# Patient Record
Sex: Male | Born: 1955 | Race: Black or African American | Hispanic: No | Smoking: Current every day smoker
Health system: Southern US, Community
[De-identification: ages and names within clinical notes are randomized; demographics above are authoritative.]

## PROBLEM LIST (undated history)

## (undated) DIAGNOSIS — E785 Hyperlipidemia, unspecified: Secondary | ICD-10-CM

## (undated) DIAGNOSIS — K219 Gastro-esophageal reflux disease without esophagitis: Secondary | ICD-10-CM

## (undated) DIAGNOSIS — E119 Type 2 diabetes mellitus without complications: Secondary | ICD-10-CM

## (undated) DIAGNOSIS — I10 Essential (primary) hypertension: Secondary | ICD-10-CM

## (undated) HISTORY — DX: Hyperlipidemia, unspecified: E78.5

## (undated) HISTORY — DX: Type 2 diabetes mellitus without complications: E11.9

## (undated) HISTORY — DX: Essential (primary) hypertension: I10

## (undated) HISTORY — PX: HERNIA REPAIR: SHX51

## (undated) HISTORY — DX: Gastro-esophageal reflux disease without esophagitis: K21.9

---

## 2018-03-04 DIAGNOSIS — R569 Unspecified convulsions: Secondary | ICD-10-CM | POA: Insufficient documentation

## 2018-03-04 DIAGNOSIS — E1100 Type 2 diabetes mellitus with hyperosmolarity without nonketotic hyperglycemic-hyperosmolar coma (NKHHC): Secondary | ICD-10-CM | POA: Insufficient documentation

## 2018-03-04 DIAGNOSIS — J9601 Acute respiratory failure with hypoxia: Secondary | ICD-10-CM | POA: Insufficient documentation

## 2018-03-04 DIAGNOSIS — I5020 Unspecified systolic (congestive) heart failure: Secondary | ICD-10-CM | POA: Insufficient documentation

## 2018-03-31 ENCOUNTER — Encounter: Payer: Self-pay | Admitting: *Deleted

## 2018-03-31 ENCOUNTER — Encounter: Payer: BLUE CROSS/BLUE SHIELD | Attending: Family Medicine | Admitting: *Deleted

## 2018-03-31 VITALS — BP 110/60 | Ht 66.0 in | Wt 169.3 lb

## 2018-03-31 DIAGNOSIS — E119 Type 2 diabetes mellitus without complications: Secondary | ICD-10-CM | POA: Insufficient documentation

## 2018-03-31 NOTE — Patient Instructions (Signed)
Check blood sugars 2-3 x day before each meal  every day Bring blood sugar records to the next class  Exercise: Begin walking for  10 minutes  3 days a week and gradually to increase 30 minutes 5 x week  Eat 3 meals day,  2  snacks a day Space meals 4-6 hours apart Avoid sugar sweetened drinks (tea, sports drinks, juices)  Quit smoking  Make an eye doctor appointment  Carry fast acting glucose and a snack at all times Rotate injection sites  Return for classes on:

## 2018-03-31 NOTE — Progress Notes (Signed)
Diabetes Self-Management Education  Visit Type: First/Initial  Appt. Start Time: 1015 Appt. End Time: 1135  03/31/2018  Tommy Moore, identified by name and date of birth, is a 63 y.o. male with a diagnosis of Diabetes: Type 2.   ASSESSMENT  Blood pressure 110/60, height 5\' 6"  (1.676 m), weight 169 lb 4.8 oz (76.8 kg). Body mass index is 27.33 kg/m.  Diabetes Self-Management Education - 03/31/18 1336      Visit Information   Visit Type  First/Initial      Initial Visit   Diabetes Type  Type 2    Are you currently following a meal plan?  Yes    What type of meal plan do you follow?  "no beef, pork; lean meats, decreased fried foods, less sweets, 3 meals/day    Are you taking your medications as prescribed?  Yes    Date Diagnosed  Feb 28, 2018      Health Coping   How would you rate your overall health?  Fair      Psychosocial Assessment   Patient Belief/Attitude about Diabetes  Motivated to manage diabetes   "disbelief"   Self-care barriers  None    Self-management support  Doctor's office;Family    Other persons present  Spouse/SO    Patient Concerns  Nutrition/Meal planning;Medication;Healthy Lifestyle;Glycemic Control    Special Needs  None    Preferred Learning Style  Visual;Hands on;Auditory    Learning Readiness  Change in progress    How often do you need to have someone help you when you read instructions, pamphlets, or other written materials from your doctor or pharmacy?  1 - Never    What is the last grade level you completed in school?  some college      Pre-Education Assessment   Patient understands the diabetes disease and treatment process.  Needs Instruction    Patient understands incorporating nutritional management into lifestyle.  Needs Instruction    Patient undertands incorporating physical activity into lifestyle.  Needs Instruction    Patient understands using medications safely.  Needs Instruction    Patient understands monitoring blood  glucose, interpreting and using results  Needs Review    Patient understands prevention, detection, and treatment of acute complications.  Needs Instruction    Patient understands prevention, detection, and treatment of chronic complications.  Needs Instruction    Patient understands how to develop strategies to address psychosocial issues.  Needs Instruction    Patient understands how to develop strategies to promote health/change behavior.  Needs Instruction      Complications   Last HgB A1C per patient/outside source  16.1 %   03/02/18   How often do you check your blood sugar?  1-2 times/day    Fasting Blood glucose range (mg/dL)  64-332   Pt reports fasting blood sugars 109 mg/dL.   Postprandial Blood glucose range (mg/dL)  --   Pt reports readings before supper 129 mg/dL.    Have you had a dilated eye exam in the past 12 months?  Yes    Have you had a dental exam in the past 12 months?  Yes    Are you checking your feet?  No      Dietary Intake   Breakfast  cereal and milk and sometimes fruit; cheese adn eggs, with tomatoes and toast; salmon and eggs with fries    Snack (morning)  peanut butter crackers, jello, chips    Lunch  meat sandwich with fruit or salad; soup  Snack (afternoon)  same as morning    Dinner  chicken fish, shrimp with bread, potatoes, peas, beans, rice pasta, lettuce, tomatoes, broccoli, spinach, greens, cabbage, squash, okra    Beverage(s)  water, Gatorade, sugar sweetened tea, fruit juice weekly      Exercise   Exercise Type  ADL's      Patient Education   Previous Diabetes Education  No    Disease state   Definition of diabetes, type 1 and 2, and the diagnosis of diabetes;Factors that contribute to the development of diabetes    Nutrition management   Role of diet in the treatment of diabetes and the relationship between the three main macronutrients and blood glucose level;Food label reading, portion sizes and measuring food.;Reviewed blood glucose goals  for pre and post meals and how to evaluate the patients' food intake on their blood glucose level.;Meal timing in regards to the patients' current diabetes medication.    Physical activity and exercise   Role of exercise on diabetes management, blood pressure control and cardiac health.    Medications  Taught/reviewed insulin injection, site rotation, insulin storage and needle disposal.;Reviewed patients medication for diabetes, action, purpose, timing of dose and side effects.    Monitoring  Purpose and frequency of SMBG.;Taught/discussed recording of test results and interpretation of SMBG.;Identified appropriate SMBG and/or A1C goals.    Acute complications  Taught treatment of hypoglycemia - the 15 rule.    Chronic complications  Relationship between chronic complications and blood glucose control;Retinopathy and reason for yearly dilated eye exams    Psychosocial adjustment  Identified and addressed patients feelings and concerns about diabetes    Personal strategies to promote health  Review risk of smoking and offered smoking cessation      Individualized Goals (developed by patient)   Reducing Risk Improve blood sugars Decrease medications Lead a healthier lifestyle Become more fit     Outcomes   Expected Outcomes  Demonstrated interest in learning. Expect positive outcomes    Future DMSE  2 wks       Individualized Plan for Diabetes Self-Management Training:   Learning Objective:  Patient will have a greater understanding of diabetes self-management. Patient education plan is to attend individual and/or group sessions per assessed needs and concerns.   Plan:   Patient Instructions  Check blood sugars 2-3 x day before each meal  every day Bring blood sugar records to the next class Exercise: Begin walking for  10 minutes  3 days a week and gradually to increase 30 minutes 5 x week Eat 3 meals day,  2  snacks a day Space meals 4-6 hours apart Avoid sugar sweetened drinks  (tea, sports drinks, juices) Quit smoking Make an eye doctor appointment Carry fast acting glucose and a snack at all times Rotate injection sites   Expected Outcomes:  Demonstrated interest in learning. Expect positive outcomes  Education material provided:  General Meal Planning Guidelines Simple Meal Plan Glucose tablets Symptoms, causes and treatments of Hypoglycemia  If problems or questions, patient to contact team via:   Sharion Settler, RN, CCM, CDE [phone]  Future DSME appointment: 2 wks  April 16, 2018 for Diabetes Class 1

## 2018-04-16 ENCOUNTER — Encounter: Payer: Self-pay | Admitting: Dietician

## 2018-04-16 ENCOUNTER — Other Ambulatory Visit: Payer: Self-pay

## 2018-04-16 ENCOUNTER — Encounter: Payer: BLUE CROSS/BLUE SHIELD | Attending: Family Medicine | Admitting: Dietician

## 2018-04-16 VITALS — Wt 170.2 lb

## 2018-04-16 DIAGNOSIS — E119 Type 2 diabetes mellitus without complications: Secondary | ICD-10-CM | POA: Diagnosis not present

## 2018-04-16 NOTE — Progress Notes (Signed)

## 2018-04-21 ENCOUNTER — Other Ambulatory Visit: Payer: Self-pay | Admitting: Family Medicine

## 2018-04-21 ENCOUNTER — Ambulatory Visit
Admission: RE | Admit: 2018-04-21 | Discharge: 2018-04-21 | Disposition: A | Discharge status: Home / Self Care | Payer: BLUE CROSS/BLUE SHIELD | Attending: Family Medicine | Admitting: Family Medicine

## 2018-04-21 ENCOUNTER — Ambulatory Visit
Admission: RE | Admit: 2018-04-21 | Discharge: 2018-04-21 | Disposition: A | Discharge status: Home / Self Care | Payer: BLUE CROSS/BLUE SHIELD | Source: Ambulatory Visit | Attending: Family Medicine | Admitting: Family Medicine

## 2018-04-21 ENCOUNTER — Other Ambulatory Visit: Payer: Self-pay

## 2018-04-21 DIAGNOSIS — R52 Pain, unspecified: Secondary | ICD-10-CM | POA: Diagnosis present

## 2018-04-23 ENCOUNTER — Encounter: Payer: BLUE CROSS/BLUE SHIELD | Admitting: *Deleted

## 2018-04-23 ENCOUNTER — Encounter: Payer: Self-pay | Admitting: *Deleted

## 2018-04-23 ENCOUNTER — Other Ambulatory Visit: Payer: Self-pay

## 2018-04-23 VITALS — Wt 172.7 lb

## 2018-04-23 DIAGNOSIS — E119 Type 2 diabetes mellitus without complications: Secondary | ICD-10-CM

## 2018-04-23 NOTE — Progress Notes (Signed)

## 2018-04-30 ENCOUNTER — Encounter: Payer: Self-pay | Admitting: Dietician

## 2018-04-30 ENCOUNTER — Encounter: Payer: BLUE CROSS/BLUE SHIELD | Admitting: Dietician

## 2018-04-30 ENCOUNTER — Other Ambulatory Visit: Payer: Self-pay

## 2018-04-30 VITALS — Wt 171.9 lb

## 2018-04-30 DIAGNOSIS — E119 Type 2 diabetes mellitus without complications: Secondary | ICD-10-CM

## 2018-04-30 NOTE — Progress Notes (Signed)
Appt. Start Time: 900 Appt. End Time: 1200  Class 3 Diabetes Overview - identify functions of pancreas and insulin; define insulin deficiency vs insulin resistance  Medications - state name, dose, timing of currently prescribed medications; describe types of medications available for diabetes  Psychosocial - identify DM as a source of stress; state the effects of stress on BG control; verbalize appropriate stress management techniques; identify personal stress issues   Nutritional Management - use food labels to identify serving size, content of carbohydrate, fiber, protein, fat, saturated fat and sodium; recognize food sources of fat, saturated fat, trans fat, and sodium, and verbalize goals for intake; describe healthful, appropriate food choices when dining out   Exercise - state a plan for personal exercise; verbalize contraindications for exercise  Self-Monitoring - state importance of SMBG; use SMBG results to effectively manage diabetes; identify importance of regular HbA1C testing and goals for results  Acute Complications - recognize hyperglycemia and hypoglycemia with causes and effects; identify blood glucose results as high, low or in control; list steps in treating and preventing high and low blood glucose  Chronic Complications - state importance of daily self-foot exams; explain appropriate eye and dental care  Lifestyle Changes/Goals & Health/Community Resources - set goals for proper diabetes care; state need for and frequency of healthcare follow-up; describe appropriate community resources for good health (ADA, web sites, apps)   Teaching Materials Used: Class 3 Slide Packet Diabetes Stress Test Stress Management Tools Stress Poem Goal Setting Worksheet Website/App List    

## 2018-05-05 ENCOUNTER — Encounter: Payer: Self-pay | Admitting: *Deleted

## 2018-10-02 DIAGNOSIS — H5203 Hypermetropia, bilateral: Secondary | ICD-10-CM | POA: Insufficient documentation

## 2018-10-02 DIAGNOSIS — E119 Type 2 diabetes mellitus without complications: Secondary | ICD-10-CM | POA: Insufficient documentation

## 2018-10-02 DIAGNOSIS — H0288A Meibomian gland dysfunction right eye, upper and lower eyelids: Secondary | ICD-10-CM | POA: Insufficient documentation

## 2018-10-02 DIAGNOSIS — Z794 Long term (current) use of insulin: Secondary | ICD-10-CM | POA: Insufficient documentation

## 2018-10-02 DIAGNOSIS — H52223 Regular astigmatism, bilateral: Secondary | ICD-10-CM | POA: Insufficient documentation

## 2019-11-18 ENCOUNTER — Encounter (INDEPENDENT_AMBULATORY_CARE_PROVIDER_SITE_OTHER): Payer: BLUE CROSS/BLUE SHIELD | Admitting: Vascular Surgery

## 2019-12-01 NOTE — Progress Notes (Signed)
MRN : [id number]  Tommy Moore is a 64 y.o. (Dec 30, 1955) male who presents with chief complaint of leg pain.  History of Present Illness:    The patient is seen for evaluation of painful lower extremities and diminished pulses. Patient notes the pain is always associated with activity and is very consistent day today. Typically, the pain occurs at less than one block, progress is as activity continues to the point that the patient must stop walking. Resting including standing still for several minutes allowed resumption of the activity and the ability to walk a similar distance before stopping again. Uneven terrain and inclined shorten the distance. The pain has been progressive over the past several years. The patient states the inability to walk is now having a profound negative impact on quality of life and daily activities.  The patient denies rest pain or dangling of an extremity off the side of the bed during the night for relief. No open wounds or sores at this time. No prior interventions or surgeries.  No history of back problems or DJD of the lumbar sacral spine.   The patient denies changes in claudication symptoms or new rest pain symptoms.  No new ulcers or wounds of the foot.  The patient's blood pressure has been stable and relatively well controlled. The patient denies amaurosis fugax or recent TIA symptoms. There are no recent neurological changes noted. The patient denies history of DVT, PE or superficial thrombophlebitis. The patient denies recent episodes of angina or shortness of breath.   ABI's at [facility] Rt=0.44 and Lt=0.69  No outpatient medications have been marked as taking for the 12/02/19 encounter (Appointment) with Gilda Crease, Latina Craver, MD.    Past Medical History:  Diagnosis Date  . Diabetes mellitus without complication (HCC)   . GERD (gastroesophageal reflux disease)   . Hyperlipidemia   . Hypertension     Past Surgical History:  Procedure  Laterality Date  . HERNIA REPAIR      Social History Social History   Tobacco Use  . Smoking status: Current Every Day Smoker    Packs/day: 0.50    Years: 40.00    Pack years: 20.00    Types: Cigarettes  . Smokeless tobacco: Never Used  Substance Use Topics  . Alcohol use: Never  . Drug use: Not on file    Family History No family history of bleeding/clotting disorders, porphyria or autoimmune disease   Allergies  Allergen Reactions  . Penicillins Rash     REVIEW OF SYSTEMS (Negative unless checked)  Constitutional: [] Weight loss  [] Fever  [] Chills Cardiac: [] Chest pain   [] Chest pressure   [] Palpitations   [] Shortness of breath when laying flat   [] Shortness of breath with exertion. Vascular:  [x] Pain in legs with walking   [] Pain in legs at rest  [] History of DVT   [] Phlebitis   [] Swelling in legs   [] Varicose veins   [] Non-healing ulcers Pulmonary:   [] Uses home oxygen   [] Productive cough   [] Hemoptysis   [] Wheeze  [] COPD   [] Asthma Neurologic:  [] Dizziness   [] Seizures   [] History of stroke   [] History of TIA  [] Aphasia   [] Vissual changes   [] Weakness or numbness in arm   [x] Weakness or numbness in leg Musculoskeletal:   [] Joint swelling   [x] Joint pain   [] Low back pain Hematologic:  [] Easy bruising  [] Easy bleeding   [] Hypercoagulable state   [] Anemic Gastrointestinal:  [] Diarrhea   [] Vomiting  [] Gastroesophageal reflux/heartburn   [] Difficulty swallowing.  Genitourinary:  [] Chronic kidney disease   [] Difficult urination  [] Frequent urination   [] Blood in urine Skin:  [] Rashes   [] Ulcers  Psychological:  [] History of anxiety   []  History of major depression.  Physical Examination  There were no vitals filed for this visit. There is no height or weight on file to calculate BMI. Gen: WD/WN, NAD Head: [place]/AT, No temporalis wasting.  Ear/Nose/Throat: Hearing grossly intact, nares w/o erythema or drainage, poor dentition Eyes: PER, EOMI, sclera nonicteric.  Neck:  Supple, no masses.  No bruit or JVD.  Pulmonary:  Good air movement, clear to auscultation bilaterally, no use of accessory muscles.  Cardiac: RRR, normal S1, S2, no Murmurs. Vascular: scattered varicosities present bilaterally.  Mild venous stasis changes to the legs bilaterally.  1+ soft pitting edema Vessel Right Left  Radial Palpable Palpable  PT Not Palpable Not Palpable  DP Not Palpable Not Palpable  Gastrointestinal: soft, non-distended. No guarding/no peritoneal signs.  Musculoskeletal: M/S 5/5 throughout.  No deformity or atrophy.  Neurologic: CN 2-12 intact. Pain and light touch intact in extremities.  Symmetrical.  Speech is fluent. Motor exam as listed above. Psychiatric: Judgment intact, Mood & affect appropriate for pt's clinical situation. Dermatologic: No rashes or ulcers noted.  No changes consistent with cellulitis.   CBC No results found for: WBC, HGB, HCT, MCV, PLT  BMET No results found for: NA, K, CL, CO2, GLUCOSE, BUN, CREATININE, CALCIUM, GFRNONAA, GFRAA CrCl cannot be calculated (No successful lab value found.).  COAG No results found for: INR, PROTIME  Radiology No results found.   Assessment/Plan 1. PAD (peripheral artery disease) (HCC) Recommend:  Patient should undergo arterial duplex of the lower extremity because he is stating he has lifestyle symptoms.  The patient states they are having increased pain and a marked decrease in the distance that they can walk.  The risks and benefits as well as the alternatives were discussed in detail with the patient.  All questions were answered.  Patient agrees to proceed and understands this could be a prelude to angiography and intervention.  The patient will follow up with me in the office to review the studies.    A total of 55 minutes was spent with this patient and greater than 50% was spent in counseling and coordination of care with the patient.  Discussion included the treatment options for vascular  disease including indications for surgery and intervention.  Also discussed is the appropriate timing of treatment.  In addition medical therapy was discussed.   - VAS AORTA/IVC/ILIACS; Future - VAS LOWER EXTREMITY ARTERIAL DUPLEX; Future  2. Type 2 diabetes mellitus without complication, with long-term current use of insulin (HCC) Continue hypoglycemic medications as already ordered, these medications have been reviewed and there are no changes at this time.  Hgb A1C to be monitored as already arranged by primary service   3. Mixed hyperlipidemia Continue statin as ordered and reviewed, no changes at this time   4. HFrEF (heart failure with reduced ejection fraction) (HCC) Continue cardiac and antihypertensive medications as already ordered and reviewed, no changes at this time.  Continue statin as ordered and reviewed, no changes at this time  Nitrates PRN for chest pain     , MD  12/01/2019 10:49 AM

## 2019-12-02 ENCOUNTER — Other Ambulatory Visit: Payer: Self-pay

## 2019-12-02 ENCOUNTER — Encounter (INDEPENDENT_AMBULATORY_CARE_PROVIDER_SITE_OTHER): Payer: Self-pay | Admitting: Vascular Surgery

## 2019-12-02 ENCOUNTER — Ambulatory Visit (INDEPENDENT_AMBULATORY_CARE_PROVIDER_SITE_OTHER): Payer: BC Managed Care – PPO | Admitting: Vascular Surgery

## 2019-12-02 VITALS — BP 131/81 | HR 102 | Resp 20 | Ht 67.0 in | Wt 186.0 lb

## 2019-12-02 DIAGNOSIS — I5020 Unspecified systolic (congestive) heart failure: Secondary | ICD-10-CM

## 2019-12-02 DIAGNOSIS — E782 Mixed hyperlipidemia: Secondary | ICD-10-CM | POA: Diagnosis not present

## 2019-12-02 DIAGNOSIS — I739 Peripheral vascular disease, unspecified: Secondary | ICD-10-CM | POA: Diagnosis not present

## 2019-12-02 DIAGNOSIS — E119 Type 2 diabetes mellitus without complications: Secondary | ICD-10-CM | POA: Diagnosis not present

## 2019-12-02 DIAGNOSIS — Z794 Long term (current) use of insulin: Secondary | ICD-10-CM

## 2019-12-17 ENCOUNTER — Other Ambulatory Visit (INDEPENDENT_AMBULATORY_CARE_PROVIDER_SITE_OTHER): Payer: Self-pay | Admitting: Vascular Surgery

## 2019-12-17 DIAGNOSIS — I739 Peripheral vascular disease, unspecified: Secondary | ICD-10-CM

## 2019-12-18 ENCOUNTER — Encounter (INDEPENDENT_AMBULATORY_CARE_PROVIDER_SITE_OTHER): Payer: Self-pay | Admitting: Vascular Surgery

## 2019-12-18 DIAGNOSIS — I739 Peripheral vascular disease, unspecified: Secondary | ICD-10-CM | POA: Insufficient documentation

## 2019-12-18 DIAGNOSIS — E785 Hyperlipidemia, unspecified: Secondary | ICD-10-CM | POA: Insufficient documentation

## 2019-12-20 ENCOUNTER — Ambulatory Visit (INDEPENDENT_AMBULATORY_CARE_PROVIDER_SITE_OTHER): Payer: BC Managed Care – PPO

## 2019-12-20 ENCOUNTER — Ambulatory Visit (INDEPENDENT_AMBULATORY_CARE_PROVIDER_SITE_OTHER): Payer: BC Managed Care – PPO | Admitting: Vascular Surgery

## 2019-12-20 ENCOUNTER — Encounter (INDEPENDENT_AMBULATORY_CARE_PROVIDER_SITE_OTHER): Payer: Self-pay | Admitting: Vascular Surgery

## 2019-12-20 ENCOUNTER — Other Ambulatory Visit: Payer: Self-pay

## 2019-12-20 VITALS — BP 136/88 | HR 96 | Resp 16 | Wt 183.6 lb

## 2019-12-20 DIAGNOSIS — I739 Peripheral vascular disease, unspecified: Secondary | ICD-10-CM

## 2019-12-20 DIAGNOSIS — E119 Type 2 diabetes mellitus without complications: Secondary | ICD-10-CM | POA: Diagnosis not present

## 2019-12-20 DIAGNOSIS — E782 Mixed hyperlipidemia: Secondary | ICD-10-CM

## 2019-12-20 DIAGNOSIS — I5020 Unspecified systolic (congestive) heart failure: Secondary | ICD-10-CM

## 2019-12-20 DIAGNOSIS — Z794 Long term (current) use of insulin: Secondary | ICD-10-CM

## 2019-12-20 NOTE — Progress Notes (Signed)
MRN : [id number]  Tommy Moore is a 64 y.o. (04/04/1955) male who presents with chief complaint of No chief complaint on file. Marland Kitchen  History of Present Illness:   The patient returns to the office for followup and review of the noninvasive studies. There has been a significant deterioration in the lower extremity symptoms.  The patient notes interval shortening of their claudication distance and development of mild rest pain symptoms. No new ulcers or wounds have occurred since the last visit.  There have been no significant changes to the patient's overall health care.  The patient denies amaurosis fugax or recent TIA symptoms. There are no recent neurological changes noted. The patient denies history of DVT, PE or superficial thrombophlebitis. The patient denies recent episodes of angina or shortness of breath.   ABI's Rt=0.62 and Lt=0.97 (previous ABI's at [facility] Rt=0.44 and Lt=0.69) Duplex US of the lower extremity arterial system shows monophasic arterial signals in the right iliac system extending down through the entire right lower extremity.  On the left there are triphasic signals noted from the aorta to the tibials.  No outpatient medications have been marked as taking for the 12/20/19 encounter (Appointment) with Gilda Crease, Latina Craver, MD.    Past Medical History:  Diagnosis Date  . Diabetes mellitus without complication (HCC)   . GERD (gastroesophageal reflux disease)   . Hyperlipidemia   . Hypertension     Past Surgical History:  Procedure Laterality Date  . HERNIA REPAIR      Social History Social History   Tobacco Use  . Smoking status: Current Every Day Smoker    Packs/day: 0.50    Years: 40.00    Pack years: 20.00    Types: Cigarettes  . Smokeless tobacco: Never Used  Substance Use Topics  . Alcohol use: Never  . Drug use: Not on file    Family History No family history on file.  Allergies  Allergen Reactions  . Penicillins Rash     REVIEW OF  SYSTEMS (Negative unless checked)  Constitutional: [] Weight loss  [] Fever  [] Chills Cardiac: [] Chest pain   [] Chest pressure   [] Palpitations   [] Shortness of breath when laying flat   [] Shortness of breath with exertion. Vascular:  [x] Pain in legs with walking   [] Pain in legs at rest  [] History of DVT   [] Phlebitis   [x] Swelling in legs   [] Varicose veins   [] Non-healing ulcers Pulmonary:   [] Uses home oxygen   [] Productive cough   [] Hemoptysis   [] Wheeze  [] COPD   [] Asthma Neurologic:  [] Dizziness   [] Seizures   [] History of stroke   [] History of TIA  [] Aphasia   [] Vissual changes   [] Weakness or numbness in arm   [] Weakness or numbness in leg Musculoskeletal:   [] Joint swelling   [x] Joint pain   [] Low back pain Hematologic:  [] Easy bruising  [] Easy bleeding   [] Hypercoagulable state   [] Anemic Gastrointestinal:  [] Diarrhea   [] Vomiting  [] Gastroesophageal reflux/heartburn   [] Difficulty swallowing. Genitourinary:  [] Chronic kidney disease   [] Difficult urination  [] Frequent urination   [] Blood in urine Skin:  [] Rashes   [] Ulcers  Psychological:  [] History of anxiety   []  History of major depression.  Physical Examination  There were no vitals filed for this visit. There is no height or weight on file to calculate BMI. Gen: WD/WN, NAD Head: [place]/AT, No temporalis wasting.  Ear/Nose/Throat: Hearing grossly intact, nares w/o erythema or drainage Eyes: PER, EOMI, sclera nonicteric.  Neck: Supple, no  large masses.   Pulmonary:  Good air movement, no audible wheezing bilaterally, no use of accessory muscles.  Cardiac: RRR, no JVD Vascular:  Mild venous stasis changes to the legs bilaterally.  2+ soft pitting edema. Vessel Right Left  Radial Palpable Palpable  PT Not Palpable Trace Palpable  DP Not Palpable Trace Palpable  Gastrointestinal: Non-distended. No guarding/no peritoneal signs.  Musculoskeletal: M/S 5/5 throughout.  No deformity or atrophy.  Neurologic: CN 2-12 intact.  Symmetrical.  Speech is fluent. Motor exam as listed above. Psychiatric: Judgment intact, Mood & affect appropriate for pt's clinical situation.  CBC No results found for: WBC, HGB, HCT, MCV, PLT  BMET No results found for: NA, K, CL, CO2, GLUCOSE, BUN, CREATININE, CALCIUM, GFRNONAA, GFRAA CrCl cannot be calculated (No successful lab value found.).  COAG No results found for: INR, PROTIME  Radiology No results found.   Assessment/Plan 1. PAD (peripheral artery disease) (HCC)  Recommend:  The patient has evidence of atherosclerosis of the lower extremities with claudication.  The patient does not voice lifestyle limiting changes at this point in time.  I have had a long discussion with the patient regarding the location of his occlusive disease and the implications for intervention in the aorta iliac system.  At the present time he would like to consider this but is not ready to commit to angiography with intervention.  He does voice an excellent understanding of the pattern of occlusive disease that he is dealing with and why he he has such short distance claudication.  Noninvasive studies do not suggest clinically significant change when compared to the ABIs obtained at [facility] clinic.  No invasive studies, angiography or surgery at this time The patient should continue walking and begin a more formal exercise program.  The patient should continue antiplatelet therapy and aggressive treatment of the lipid abnormalities  No changes in the patient's medications at this time  The patient should continue wearing graduated compression socks 10-15 mmHg strength to control the mild edema.   - VAS [place] ABI WITH/WO TBI; Future  2. HFrEF (heart failure with reduced ejection fraction) (HCC) Continue cardiac and antihypertensive medications as already ordered and reviewed, no changes at this time.  Continue statin as ordered and reviewed, no changes at this time  Nitrates PRN for chest  pain   3. Type 2 diabetes mellitus without complication, with long-term current use of insulin (HCC) Continue hypoglycemic medications as already ordered, these medications have been reviewed and there are no changes at this time.  Hgb A1C to be monitored as already arranged by primary service   4. Mixed hyperlipidemia Continue statin as ordered and reviewed, no changes at this time     Levora Dredge, MD  12/20/2019 8:22 AM

## 2019-12-21 ENCOUNTER — Encounter (INDEPENDENT_AMBULATORY_CARE_PROVIDER_SITE_OTHER): Payer: Self-pay | Admitting: Vascular Surgery

## 2020-02-08 IMAGING — CR RIGHT ANKLE - COMPLETE 3+ VIEW
3 series · 3 of 3 positions shown · non-contrast
Comparison: None.

CLINICAL DATA: Ankle pain

EXAM:
RIGHT ANKLE - COMPLETE 3+ VIEW

[ankle ap]
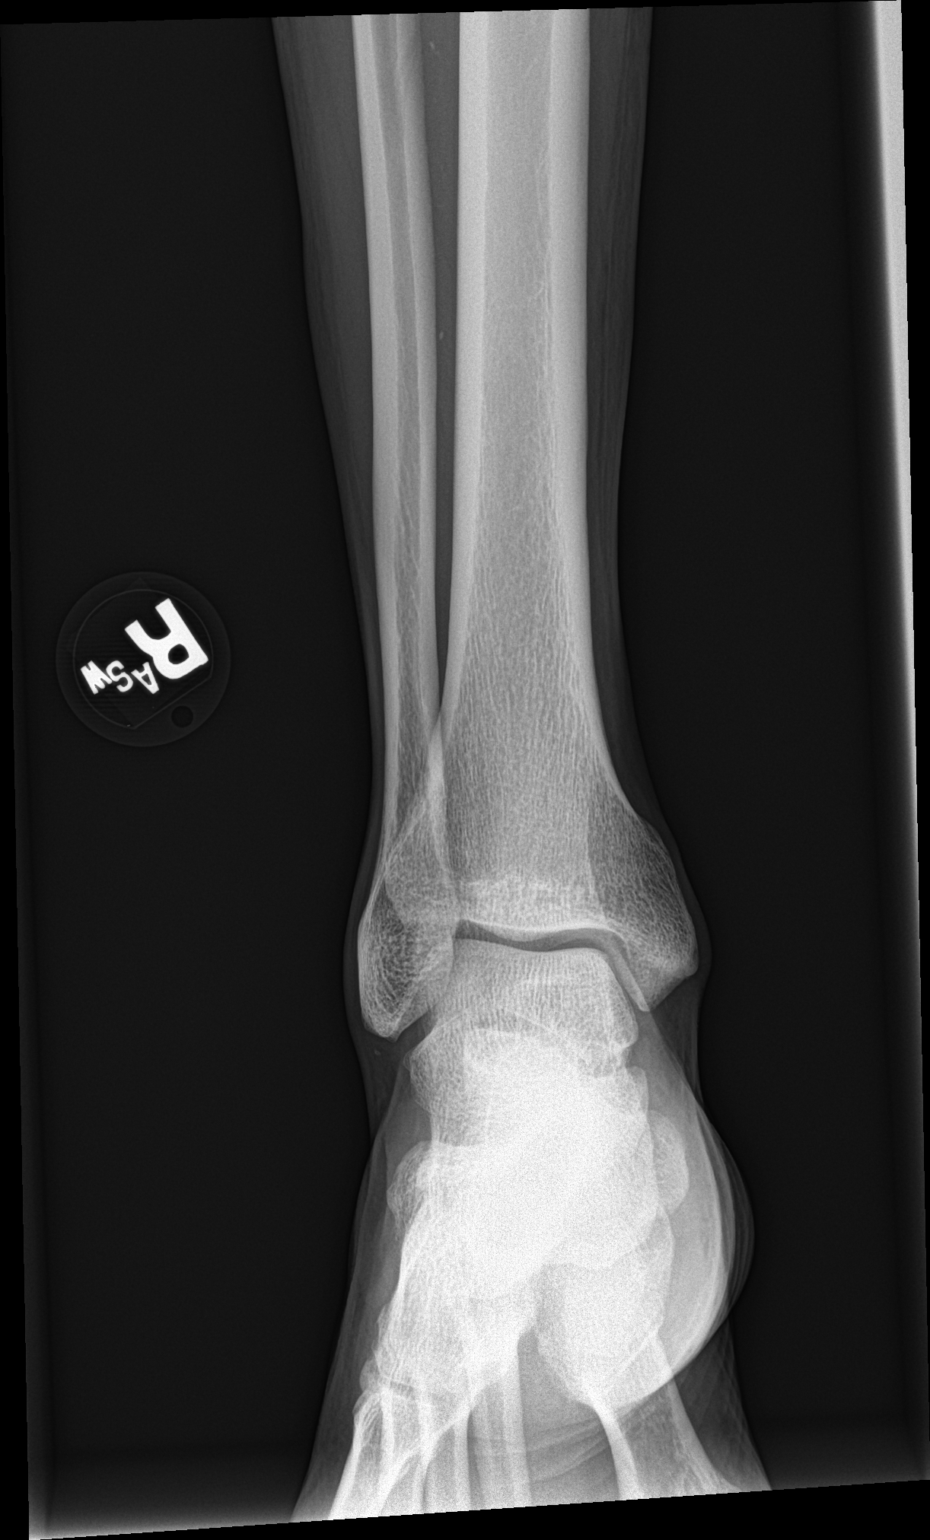

[ankle obl]
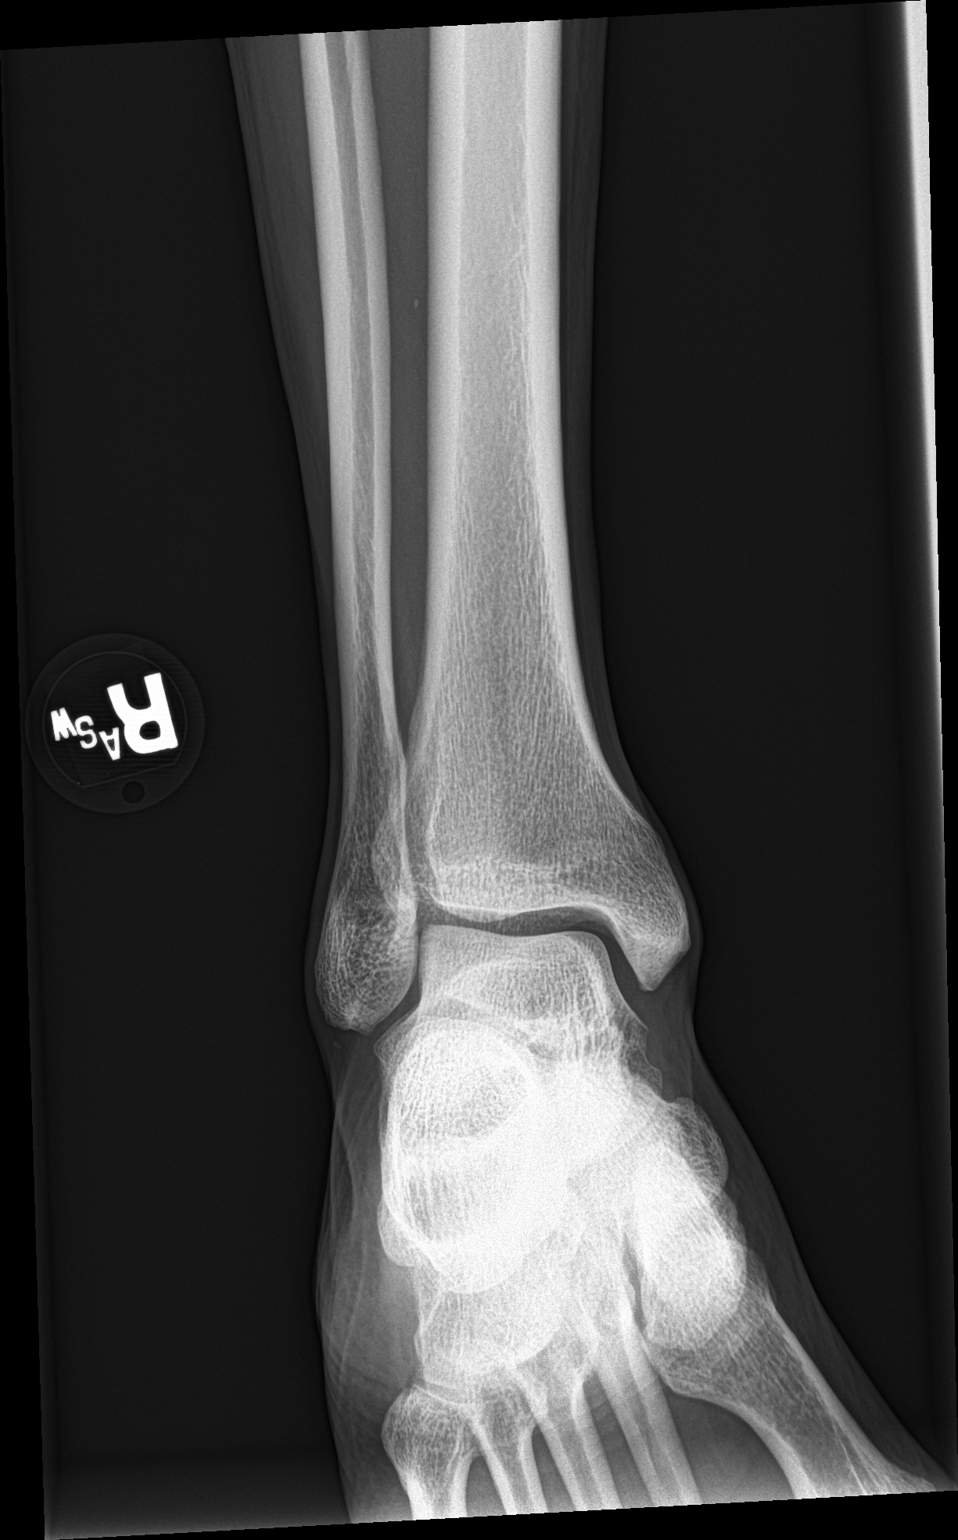

[ankle lat]
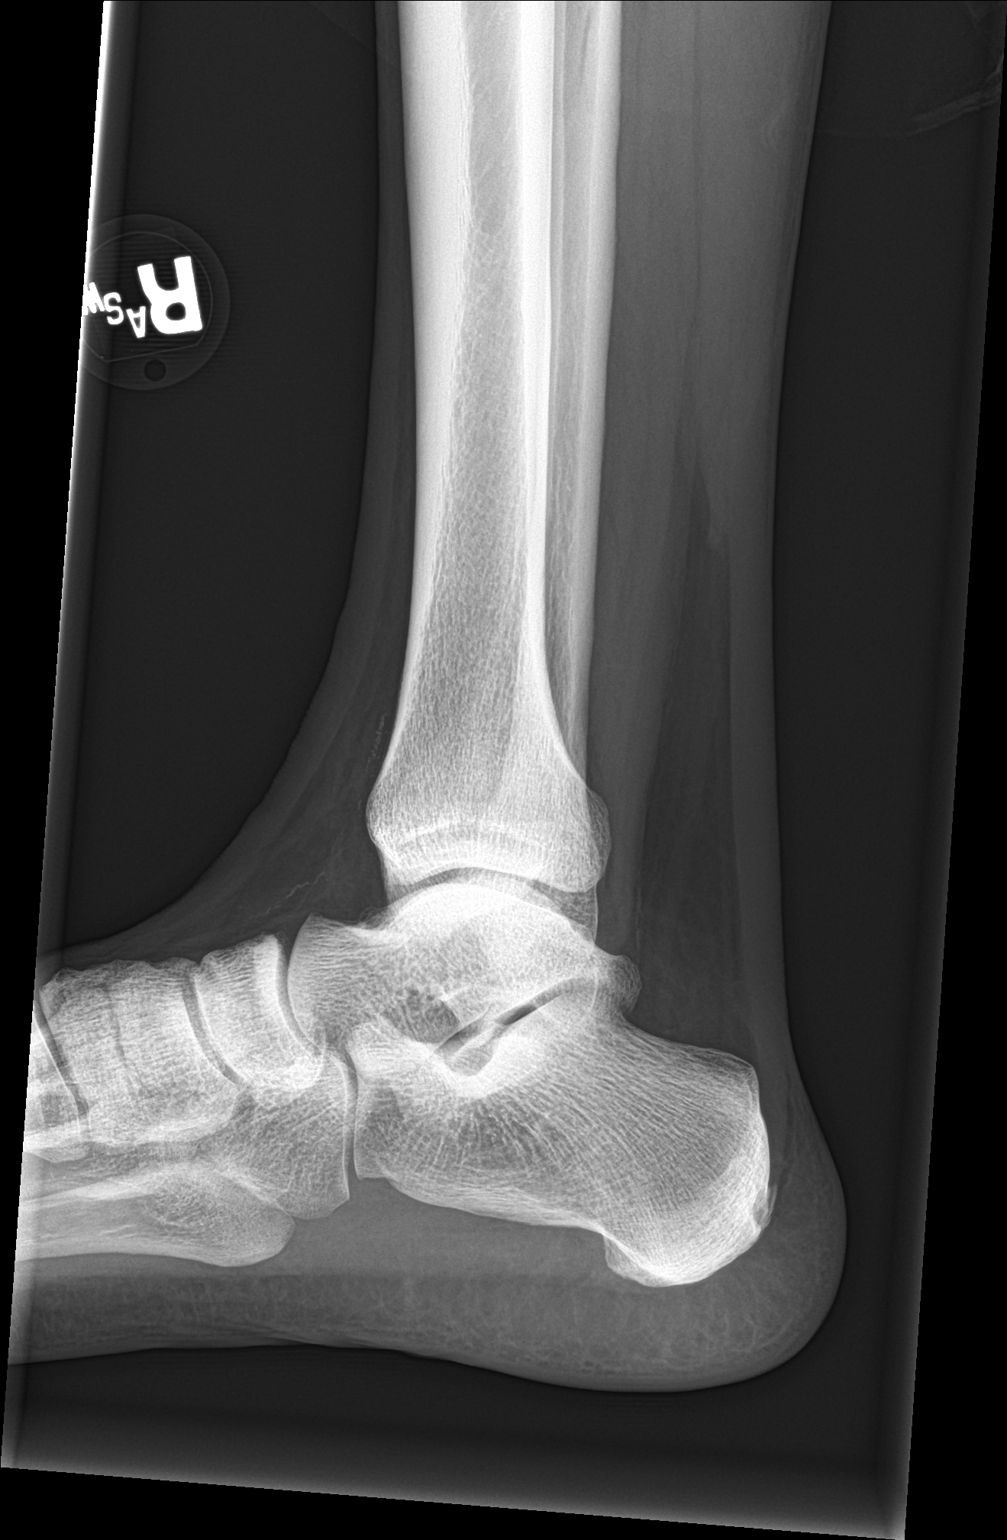

[3 of 3 positions shown; findings below may reference images not displayed]

FINDINGS: There is no evidence of fracture, dislocation, or joint effusion.
There is no evidence of arthropathy or other focal bone abnormality.
Mild vascular calcifications.
IMPRESSION: Negative.

## 2020-06-19 ENCOUNTER — Encounter (INDEPENDENT_AMBULATORY_CARE_PROVIDER_SITE_OTHER): Payer: Self-pay | Admitting: Vascular Surgery

## 2020-06-19 ENCOUNTER — Ambulatory Visit (INDEPENDENT_AMBULATORY_CARE_PROVIDER_SITE_OTHER): Payer: BC Managed Care – PPO

## 2020-06-19 ENCOUNTER — Ambulatory Visit (INDEPENDENT_AMBULATORY_CARE_PROVIDER_SITE_OTHER): Payer: BC Managed Care – PPO | Admitting: Vascular Surgery

## 2020-06-19 ENCOUNTER — Other Ambulatory Visit: Payer: Self-pay

## 2020-06-19 VITALS — BP 120/80 | HR 94 | Resp 16 | Wt 185.0 lb

## 2020-06-19 DIAGNOSIS — I739 Peripheral vascular disease, unspecified: Secondary | ICD-10-CM | POA: Diagnosis not present

## 2020-06-19 DIAGNOSIS — K219 Gastro-esophageal reflux disease without esophagitis: Secondary | ICD-10-CM | POA: Diagnosis not present

## 2020-06-19 DIAGNOSIS — E119 Type 2 diabetes mellitus without complications: Secondary | ICD-10-CM | POA: Diagnosis not present

## 2020-06-19 DIAGNOSIS — E782 Mixed hyperlipidemia: Secondary | ICD-10-CM | POA: Diagnosis not present

## 2020-06-19 DIAGNOSIS — Z794 Long term (current) use of insulin: Secondary | ICD-10-CM

## 2020-06-19 NOTE — Progress Notes (Signed)
MRN : [id number]  Tommy Moore is a 65 y.o. ([birth year]) male who presents with chief complaint of No chief complaint on file. Marland Kitchen  History of Present Illness:   The patient returns to the office for followup and review of the noninvasive studies. There has been a significant deterioration in the lower extremity symptoms. The patient notes interval shortening of their claudication distance and development of mild rest pain symptoms. No new ulcers or wounds have occurred since the last visit.  There have been no significant changes to the patient's overall health care.  The patient denies amaurosis fugax or recent TIA symptoms. There are no recent neurological changes noted. The patient denies history of DVT, PE or superficial thrombophlebitis. The patient denies recent episodes of angina or shortness of breath.   ABI's Rt=0.47 and Lt=0.73 (previous ABI's Rt=0.62 and Lt=0.97) Previous duplex US of the lower extremity arterial system shows monophasic arterial signals in the right iliac system extending down through the entire right lower extremity.  On the left there are triphasic signals noted from the aorta to the tibials.  No outpatient medications have been marked as taking for the 06/19/20 encounter (Appointment) with Gilda Crease, Latina Craver, MD.    Past Medical History:  Diagnosis Date  . Diabetes mellitus without complication (HCC)   . GERD (gastroesophageal reflux disease)   . Hyperlipidemia   . Hypertension     Past Surgical History:  Procedure Laterality Date  . HERNIA REPAIR      Social History Social History   Tobacco Use  . Smoking status: Current Every Day Smoker    Packs/day: 0.50    Years: 40.00    Pack years: 20.00    Types: Cigarettes  . Smokeless tobacco: Never Used  Substance Use Topics  . Alcohol use: Never    Family History No family history on file.  Allergies  Allergen Reactions  . Penicillins Rash     REVIEW OF SYSTEMS (Negative  unless checked)  Constitutional: [] Weight loss  [] Fever  [] Chills Cardiac: [] Chest pain   [] Chest pressure   [] Palpitations   [] Shortness of breath when laying flat   [] Shortness of breath with exertion. Vascular:  [x] Pain in legs with walking   [] Pain in legs at rest  [] History of DVT   [] Phlebitis   [x] Swelling in legs   [] Varicose veins   [] Non-healing ulcers Pulmonary:   [] Uses home oxygen   [] Productive cough   [] Hemoptysis   [] Wheeze  [] COPD   [] Asthma Neurologic:  [] Dizziness   [] Seizures   [] History of stroke   [] History of TIA  [] Aphasia   [] Vissual changes   [] Weakness or numbness in arm   [] Weakness or numbness in leg Musculoskeletal:   [] Joint swelling   [x] Joint pain   [] Low back pain Hematologic:  [] Easy bruising  [] Easy bleeding   [] Hypercoagulable state   [] Anemic Gastrointestinal:  [] Diarrhea   [] Vomiting  [x] Gastroesophageal reflux/heartburn   [] Difficulty swallowing. Genitourinary:  [] Chronic kidney disease   [] Difficult urination  [] Frequent urination   [] Blood in urine Skin:  [] Rashes   [] Ulcers  Psychological:  [] History of anxiety   []  History of major depression.  Physical Examination  There were no vitals filed for this visit. There is no height or weight on file to calculate BMI. Gen: WD/WN, NAD Head: [place]/AT, No temporalis wasting.  Ear/Nose/Throat: Hearing grossly intact, nares w/o erythema or drainage Eyes: PER, EOMI, sclera nonicteric.  Neck: Supple, no large masses.   Pulmonary:  Good air movement,  no audible wheezing bilaterally, no use of accessory muscles.  Cardiac: RRR, no JVD Vascular: scattered varicosities present bilaterally.  Mild venous stasis changes to the legs bilaterally.  2+ soft pitting edema. Vessel Right Left  Radial Palpable Palpable  PT Not Palpable Not Palpable  DP Not Palpable Not Palpable  Gastrointestinal: Non-distended. No guarding/no peritoneal signs.  Musculoskeletal: M/S 5/5 throughout.  No deformity or atrophy.  Neurologic: CN  2-12 intact. Symmetrical.  Speech is fluent. Motor exam as listed above. Psychiatric: Judgment intact, Mood & affect appropriate for pt's clinical situation. Dermatologic: No rashes or ulcers noted.  No changes consistent with cellulitis.   CBC No results found for: WBC, HGB, HCT, MCV, PLT  BMET No results found for: NA, K, CL, CO2, GLUCOSE, BUN, CREATININE, CALCIUM, GFRNONAA, GFRAA CrCl cannot be calculated (No successful lab value found.).  COAG No results found for: INR, PROTIME  Radiology No results found.   Assessment/Plan 1. PAD (peripheral artery disease) (HCC)  Recommend:  The patient has evidence of atherosclerosis of the lower extremities with claudication.  The patient does not voice lifestyle limiting changes at this point in time.  Noninvasive studies do not suggest clinically significant change.  No invasive studies, angiography or surgery at this time The patient should continue walking and begin a more formal exercise program.  The patient should continue antiplatelet therapy and aggressive treatment of the lipid abnormalities  No changes in the patient's medications at this time  The patient should continue wearing graduated compression socks 10-15 mmHg strength to control the mild edema.   - VAS [place] ABI WITH/WO TBI; Future  2. Type 2 diabetes mellitus without complication, with long-term current use of insulin (HCC) Continue hypoglycemic medications as already ordered, these medications have been reviewed and there are no changes at this time.  Hgb A1C to be monitored as already arranged by primary service   3. Mixed hyperlipidemia Continue statin as ordered and reviewed, no changes at this time   4. Gastroesophageal reflux disease, unspecified whether esophagitis present Continue PPI as already ordered, this medication has been reviewed and there are no changes at this time.  Avoidence of caffeine and alcohol  Moderate elevation of the head of the  bed     Levora Dredge, MD  06/19/2020 1:14 PM

## 2020-06-20 ENCOUNTER — Encounter (INDEPENDENT_AMBULATORY_CARE_PROVIDER_SITE_OTHER): Payer: Self-pay | Admitting: Vascular Surgery

## 2020-06-20 DIAGNOSIS — K219 Gastro-esophageal reflux disease without esophagitis: Secondary | ICD-10-CM | POA: Insufficient documentation

## 2021-02-09 ENCOUNTER — Ambulatory Visit
Admission: RE | Admit: 2021-02-09 | Discharge: 2021-02-09 | Disposition: A | Discharge status: Home / Self Care | Payer: BC Managed Care – PPO | Source: Ambulatory Visit | Attending: Family Medicine | Admitting: Family Medicine

## 2021-02-09 ENCOUNTER — Other Ambulatory Visit: Payer: Self-pay

## 2021-02-09 ENCOUNTER — Other Ambulatory Visit: Payer: Self-pay | Admitting: Family Medicine

## 2021-02-09 ENCOUNTER — Ambulatory Visit
Admission: RE | Admit: 2021-02-09 | Discharge: 2021-02-09 | Disposition: A | Discharge status: Home / Self Care | Payer: BC Managed Care – PPO | Attending: Family Medicine | Admitting: Family Medicine

## 2021-02-09 DIAGNOSIS — R0602 Shortness of breath: Secondary | ICD-10-CM

## 2021-06-17 NOTE — Progress Notes (Signed)
? ? ? ? ?MRN : [id number] ? ?Tommy Moore is a 66 y.o. (09-Jun-1955) male who presents with chief complaint of check circulation. ? ?History of Present Illness:  ? ?The patient returns to the office for followup and review of the noninvasive studies. There has been a significant deterioration in the lower extremity symptoms.  The patient notes interval shortening of their claudication distance and development of mild rest pain symptoms. No new ulcers or wounds have occurred since the last visit. ?  ?There have been no significant changes to the patient's overall health care. ?  ?The patient denies amaurosis fugax or recent TIA symptoms. There are no recent neurological changes noted. ?The patient denies history of DVT, PE or superficial thrombophlebitis. ?The patient denies recent episodes of angina or shortness of breath.  ?  ?ABI's Rt=0.0.39 and Lt=0.70 (previous ABI's Rt=0.47 and Lt=0.73) ?Previous duplex US of the lower extremity arterial system shows monophasic arterial signals in the right iliac system extending down through the entire right lower extremity.  On the left there are triphasic signals noted from the aorta to the tibials. ? ?No outpatient medications have been marked as taking for the 06/18/21 encounter (Appointment) with Gilda Crease, Latina Craver, MD.  ? ? ?Past Medical History:  ?Diagnosis Date  ? Diabetes mellitus without complication (HCC)   ? GERD (gastroesophageal reflux disease)   ? Hyperlipidemia   ? Hypertension   ? ? ?Past Surgical History:  ?Procedure Laterality Date  ? HERNIA REPAIR    ? ? ?Social History ?Social History  ? ?Tobacco Use  ? Smoking status: Every Day  ?  Packs/day: 0.50  ?  Years: 40.00  ?  Pack years: 20.00  ?  Types: Cigarettes  ? Smokeless tobacco: Never  ?Substance Use Topics  ? Alcohol use: Never  ? Drug use: Never  ? ? ?Family History ?Family History  ?Problem Relation Age of Onset  ? Breast cancer Mother   ? Breast cancer Maternal Aunt   ? ? ?Allergies  ?Allergen  Reactions  ? Penicillins Rash  ? ? ? ?REVIEW OF SYSTEMS (Negative unless checked) ? ?Constitutional: [] Weight loss  [] Fever  [] Chills ?Cardiac: [] Chest pain   [] Chest pressure   [] Palpitations   [] Shortness of breath when laying flat   [] Shortness of breath with exertion. ?Vascular:  [x] Pain in legs with walking   [] Pain in legs at rest  [] History of DVT   [] Phlebitis   [] Swelling in legs   [] Varicose veins   [] Non-healing ulcers ?Pulmonary:   [] Uses home oxygen   [] Productive cough   [] Hemoptysis   [] Wheeze  [] COPD   [] Asthma ?Neurologic:  [] Dizziness   [] Seizures   [] History of stroke   [] History of TIA  [] Aphasia   [] Vissual changes   [] Weakness or numbness in arm   [] Weakness or numbness in leg ?Musculoskeletal:   [] Joint swelling   [] Joint pain   [] Low back pain ?Hematologic:  [] Easy bruising  [] Easy bleeding   [] Hypercoagulable state   [] Anemic ?Gastrointestinal:  [] Diarrhea   [] Vomiting  [x] Gastroesophageal reflux/heartburn   [] Difficulty swallowing. ?Genitourinary:  [] Chronic kidney disease   [] Difficult urination  [] Frequent urination   [] Blood in urine ?Skin:  [] Rashes   [] Ulcers  ?Psychological:  [] History of anxiety   []  History of major depression. ? ?Physical Examination ? ?There were no vitals filed for this visit. ?There is no height or weight on file to calculate BMI. ?Gen: WD/WN, NAD ?Head: [place]/AT, No temporalis wasting.  ?Ear/Nose/Throat: Hearing grossly intact, nares  w/o erythema or drainage ?Eyes: PER, EOMI, sclera nonicteric.  ?Neck: Supple, no masses.  No bruit or JVD.  ?Pulmonary:  Good air movement, no audible wheezing, no use of accessory muscles.  ?Cardiac: RRR, normal S1, S2, no Murmurs. ?Vascular:  mild trophic changes, no open wounds ?Vessel Right Left  ?Radial Palpable Palpable  ?PT Not Palpable Not Palpable  ?DP Not Palpable Not Palpable  ?Gastrointestinal: soft, non-distended. No guarding/no peritoneal signs.  ?Musculoskeletal: M/S 5/5 throughout.  No visible deformity.  ?Neurologic:  CN 2-12 intact. Pain and light touch intact in extremities.  Symmetrical.  Speech is fluent. Motor exam as listed above. ?Psychiatric: Judgment intact, Mood & affect appropriate for pt's clinical situation. ?Dermatologic: No rashes or ulcers noted.  No changes consistent with cellulitis. ? ? ?CBC ?No results found for: WBC, HGB, HCT, MCV, PLT ? ?BMET ?No results found for: NA, K, CL, CO2, GLUCOSE, BUN, CREATININE, CALCIUM, GFRNONAA, GFRAA ?CrCl cannot be calculated (No successful lab value found.). ? ?COAG ?No results found for: INR, PROTIME ? ?Radiology ?No results found. ? ? ?Assessment/Plan ?1. PAD (peripheral artery disease) (HCC) ? Recommend: ? ?The patient has evidence of atherosclerosis of the lower extremities with claudication.  The patient does not voice lifestyle limiting changes at this point in time. ? ?Noninvasive studies do not suggest clinically significant change. ? ?No invasive studies, angiography or surgery at this time ?The patient should continue walking and begin a more formal exercise program.  ?The patient should continue antiplatelet therapy and aggressive treatment of the lipid abnormalities ? ?No changes in the patient's medications at this time ? ?Continued surveillance is indicated as atherosclerosis is likely to progress with time.   ? ?The patient will continue follow up with noninvasive studies as ordered.   ?- VAS [place] ABI WITH/WO TBI; Future ? ?2. Mixed hyperlipidemia ?Continue statin as ordered and reviewed, no changes at this time  ? ?3. Type 2 diabetes mellitus without complication, with long-term current use of insulin (HCC) ?Continue hypoglycemic medications as already ordered, these medications have been reviewed and there are no changes at this time. ? ?Hgb A1C to be monitored as already arranged by primary service  ? ?4. Gastroesophageal reflux disease, unspecified whether esophagitis present ?Continue PPI as already ordered, this medication has been reviewed and there are no  changes at this time. ? ?Avoidence of caffeine and alcohol ? ?Moderate elevation of the head of the bed   ? ? ? ?Levora Dredge, MD ? ?06/17/2021 ?1:16 PM ? ?  ?

## 2021-06-18 ENCOUNTER — Ambulatory Visit (INDEPENDENT_AMBULATORY_CARE_PROVIDER_SITE_OTHER): Payer: BC Managed Care – PPO | Admitting: Vascular Surgery

## 2021-06-18 ENCOUNTER — Ambulatory Visit (INDEPENDENT_AMBULATORY_CARE_PROVIDER_SITE_OTHER): Payer: BC Managed Care – PPO

## 2021-06-18 ENCOUNTER — Encounter (INDEPENDENT_AMBULATORY_CARE_PROVIDER_SITE_OTHER): Payer: Self-pay | Admitting: Vascular Surgery

## 2021-06-18 VITALS — BP 109/77 | HR 79 | Resp 17 | Ht 67.0 in | Wt 187.0 lb

## 2021-06-18 DIAGNOSIS — E782 Mixed hyperlipidemia: Secondary | ICD-10-CM | POA: Diagnosis not present

## 2021-06-18 DIAGNOSIS — K219 Gastro-esophageal reflux disease without esophagitis: Secondary | ICD-10-CM | POA: Diagnosis not present

## 2021-06-18 DIAGNOSIS — E119 Type 2 diabetes mellitus without complications: Secondary | ICD-10-CM

## 2021-06-18 DIAGNOSIS — I739 Peripheral vascular disease, unspecified: Secondary | ICD-10-CM

## 2021-06-18 DIAGNOSIS — Z794 Long term (current) use of insulin: Secondary | ICD-10-CM

## 2022-06-18 ENCOUNTER — Other Ambulatory Visit (INDEPENDENT_AMBULATORY_CARE_PROVIDER_SITE_OTHER): Payer: Self-pay | Admitting: Vascular Surgery

## 2022-06-18 DIAGNOSIS — I739 Peripheral vascular disease, unspecified: Secondary | ICD-10-CM

## 2022-06-20 ENCOUNTER — Ambulatory Visit (INDEPENDENT_AMBULATORY_CARE_PROVIDER_SITE_OTHER): Payer: BC Managed Care – PPO | Admitting: Vascular Surgery

## 2022-06-20 ENCOUNTER — Encounter (INDEPENDENT_AMBULATORY_CARE_PROVIDER_SITE_OTHER): Payer: BC Managed Care – PPO

## 2022-07-11 ENCOUNTER — Ambulatory Visit (INDEPENDENT_AMBULATORY_CARE_PROVIDER_SITE_OTHER): Payer: BC Managed Care – PPO | Admitting: Vascular Surgery

## 2022-07-11 ENCOUNTER — Encounter (INDEPENDENT_AMBULATORY_CARE_PROVIDER_SITE_OTHER): Payer: BC Managed Care – PPO

## 2022-10-24 ENCOUNTER — Other Ambulatory Visit: Payer: Self-pay

## 2022-10-24 ENCOUNTER — Other Ambulatory Visit (HOSPITAL_COMMUNITY): Payer: Self-pay

## 2022-10-24 ENCOUNTER — Emergency Department: Payer: Medicare Other

## 2022-10-24 ENCOUNTER — Emergency Department
Admission: EM | Admit: 2022-10-24 | Discharge: 2022-10-24 | Disposition: A | Discharge status: Home / Self Care | Payer: Medicare Other | Attending: Emergency Medicine | Admitting: Emergency Medicine

## 2022-10-24 DIAGNOSIS — R0789 Other chest pain: Secondary | ICD-10-CM

## 2022-10-24 DIAGNOSIS — E119 Type 2 diabetes mellitus without complications: Secondary | ICD-10-CM | POA: Diagnosis not present

## 2022-10-24 DIAGNOSIS — R079 Chest pain, unspecified: Secondary | ICD-10-CM | POA: Diagnosis present

## 2022-10-24 DIAGNOSIS — F419 Anxiety disorder, unspecified: Secondary | ICD-10-CM | POA: Diagnosis not present

## 2022-10-24 DIAGNOSIS — Z20822 Contact with and (suspected) exposure to covid-19: Secondary | ICD-10-CM | POA: Insufficient documentation

## 2022-10-24 DIAGNOSIS — I10 Essential (primary) hypertension: Secondary | ICD-10-CM | POA: Insufficient documentation

## 2022-10-24 LAB — BASIC METABOLIC PANEL
Anion gap: 13 (ref 5–15)
BUN: 10 mg/dL (ref 8–23)
CO2: 20 mmol/L — ABNORMAL LOW (ref 22–32)
Calcium: 9 mg/dL (ref 8.9–10.3)
Chloride: 101 mmol/L (ref 98–111)
Creatinine, Ser: 0.83 mg/dL (ref 0.61–1.24)
GFR, Estimated: >60 mL/min (ref >60)
Glucose, Bld: 224 mg/dL — ABNORMAL HIGH (ref 70–99)
Potassium: 4.1 mmol/L (ref 3.5–5.1)
Sodium: 134 mmol/L — ABNORMAL LOW (ref 135–145)

## 2022-10-24 LAB — HEPATIC FUNCTION PANEL
ALT: 23 U/L (ref 0–44)
AST: 22 U/L (ref 15–41)
Albumin: 4 g/dL (ref 3.5–5.0)
Alkaline Phosphatase: 68 U/L (ref 38–126)
Bilirubin, Direct: <0.1 mg/dL (ref 0.0–0.2)
Total Bilirubin: 0.5 mg/dL (ref 0.3–1.2)
Total Protein: 7.6 g/dL (ref 6.5–8.1)

## 2022-10-24 LAB — CBC
HCT: 42.9 % (ref 39.0–52.0)
Hemoglobin: 13 g/dL (ref 13.0–17.0)
MCH: 21.1 pg — ABNORMAL LOW (ref 26.0–34.0)
MCHC: 30.3 g/dL (ref 30.0–36.0)
MCV: 69.6 fL — ABNORMAL LOW (ref 80.0–100.0)
Platelets: 385 10*3/uL (ref 150–400)
RBC: 6.16 MIL/uL — ABNORMAL HIGH (ref 4.22–5.81)
RDW: 15.2 % (ref 11.5–15.5)
WBC: 6.3 10*3/uL (ref 4.0–10.5)
nRBC: 0 % (ref 0.0–0.2)

## 2022-10-24 LAB — TROPONIN I (HIGH SENSITIVITY)
Troponin I (High Sensitivity): 8 ng/L (ref <18)
Troponin I (High Sensitivity): 8 ng/L (ref <18)

## 2022-10-24 LAB — LIPASE, BLOOD: Lipase: 37 U/L (ref 11–51)

## 2022-10-24 LAB — D-DIMER, QUANTITATIVE: D-Dimer, Quant: 0.48 ug/mL-FEU (ref 0.00–0.50)

## 2022-10-24 LAB — SARS CORONAVIRUS 2 BY RT PCR: SARS Coronavirus 2 by RT PCR: NEGATIVE

## 2022-10-24 MED ORDER — ALUM & MAG HYDROXIDE-SIMETH 200-200-20 MG/5ML PO SUSP
30.0000 mL | Freq: Once | ORAL | Status: AC
  Administered 2022-10-24: 30 mL via ORAL
  Filled 2022-10-24: qty 30

## 2022-10-24 MED ORDER — FAMOTIDINE 20 MG PO TABS
40.0000 mg | ORAL_TABLET | Freq: Once | ORAL | Status: AC
  Administered 2022-10-24: 40 mg via ORAL
  Filled 2022-10-24: qty 2

## 2022-10-24 MED ORDER — DIAZEPAM 5 MG/ML IJ SOLN
10.0000 mg | Freq: Once | INTRAMUSCULAR | Status: AC
  Administered 2022-10-24: 10 mg via INTRAVENOUS
  Filled 2022-10-24: qty 2

## 2022-10-24 MED ORDER — NAPROXEN 500 MG PO TABS
500.0000 mg | ORAL_TABLET | Freq: Once | ORAL | Status: AC
  Administered 2022-10-24: 500 mg via ORAL
  Filled 2022-10-24: qty 1
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
## 2022-10-24 NOTE — ED Triage Notes (Signed)
Pt comes with c/o left sided cp. Pt states this all started last night. Pt states his heart started racing this morning. Pt states his left hand started to shake and he got sob. Pt states no N/V. Pt states his head hurts and his left eye is twitching.

## 2022-10-24 NOTE — Discharge Instructions (Addendum)
You can take your prescriptions to the [facility] for help getting your medications.

## 2022-10-25 ENCOUNTER — Other Ambulatory Visit: Payer: Self-pay

## 2022-10-25 MED ORDER — OLANZAPINE 2.5 MG PO TABS
2.5000 mg | ORAL_TABLET | Freq: Every day | ORAL | 0 refills | Status: AC
  Filled 2022-10-25: qty 30, 30d supply, fill #0

## 2022-10-25 MED ORDER — ASPIRIN 81 MG PO TBEC
81.0000 mg | DELAYED_RELEASE_TABLET | Freq: Every day | ORAL | 0 refills | Status: AC
  Filled 2022-10-25: qty 30, 30d supply, fill #0

## 2022-10-25 MED ORDER — ATORVASTATIN CALCIUM 40 MG PO TABS
40.0000 mg | ORAL_TABLET | Freq: Every evening | ORAL | 0 refills | Status: AC
  Filled 2022-10-25: qty 30, 30d supply, fill #0

## 2022-10-25 MED ORDER — METFORMIN HCL ER 500 MG PO TB24
500.0000 mg | ORAL_TABLET | Freq: Two times a day (BID) | ORAL | 0 refills | Status: AC
  Filled 2022-10-25: qty 60, 30d supply, fill #0

## 2022-10-25 MED ORDER — OMEPRAZOLE 20 MG PO CPDR
20.0000 mg | DELAYED_RELEASE_CAPSULE | Freq: Every day | ORAL | 0 refills | Status: AC
  Filled 2022-10-25: qty 30, 30d supply, fill #0

## 2022-10-25 MED ORDER — CARVEDILOL 3.125 MG PO TABS
3.1250 mg | ORAL_TABLET | Freq: Two times a day (BID) | ORAL | 0 refills | Status: AC
  Filled 2022-10-25: qty 60, 30d supply, fill #0

## 2022-10-25 MED ORDER — TRAZODONE HCL 100 MG PO TABS
100.0000 mg | ORAL_TABLET | Freq: Every day | ORAL | 0 refills | Status: AC
  Filled 2022-10-25: qty 30, 30d supply, fill #0

## 2022-10-25 MED ORDER — SACUBITRIL-VALSARTAN 24-26 MG PO TABS
1.0000 | ORAL_TABLET | Freq: Two times a day (BID) | ORAL | 0 refills | Status: AC
  Filled 2022-10-25: qty 60, 30d supply, fill #0

## 2022-10-25 MED ORDER — FLUOXETINE HCL 20 MG PO CAPS
20.0000 mg | ORAL_CAPSULE | Freq: Every day | ORAL | 0 refills | Status: AC
  Filled 2022-10-25: qty 30, 30d supply, fill #0
